# Patient Record
Sex: Male | Born: 1960 | Race: White | Hispanic: No | Marital: Married | Smoking: Never smoker
Health system: Southern US, Community
[De-identification: ages and names within clinical notes are randomized; demographics above are authoritative.]

## PROBLEM LIST (undated history)

## (undated) DIAGNOSIS — T7840XA Allergy, unspecified, initial encounter: Secondary | ICD-10-CM

## (undated) HISTORY — DX: Allergy, unspecified, initial encounter: T78.40XA

## (undated) HISTORY — PX: APPENDECTOMY: SHX54

---

## 2010-01-26 ENCOUNTER — Encounter (INDEPENDENT_AMBULATORY_CARE_PROVIDER_SITE_OTHER): Payer: Self-pay | Admitting: Surgery

## 2010-01-26 ENCOUNTER — Inpatient Hospital Stay (HOSPITAL_COMMUNITY): Admission: EM | Admit: 2010-01-26 | Discharge: 2010-01-27 | Payer: Self-pay | Admitting: Emergency Medicine

## 2010-08-18 LAB — DIFFERENTIAL
Basophils Absolute: 0 10*3/uL (ref 0.0–0.1)
Eosinophils Absolute: 0 10*3/uL (ref 0.0–0.7)
Eosinophils Relative: 0 % (ref 0–5)
Monocytes Relative: 5 % (ref 3–12)

## 2010-08-18 LAB — URINALYSIS, ROUTINE W REFLEX MICROSCOPIC
Bilirubin Urine: NEGATIVE
Hgb urine dipstick: NEGATIVE
Protein, ur: NEGATIVE mg/dL
Urobilinogen, UA: 0.2 mg/dL (ref 0.0–1.0)

## 2010-08-18 LAB — COMPREHENSIVE METABOLIC PANEL
Alkaline Phosphatase: 86 U/L (ref 39–117)
BUN: 16 mg/dL (ref 6–23)
CO2: 27 mEq/L (ref 19–32)
Calcium: 9.3 mg/dL (ref 8.4–10.5)
Chloride: 101 mEq/L (ref 96–112)
Creatinine, Ser: 1 mg/dL (ref 0.4–1.5)
GFR calc Af Amer: >60 mL/min (ref >60)
Glucose, Bld: 130 mg/dL — ABNORMAL HIGH (ref 70–99)
Sodium: 136 mEq/L (ref 135–145)

## 2010-08-18 LAB — CBC
HCT: 41.8 % (ref 39.0–52.0)
Hemoglobin: 14.2 g/dL (ref 13.0–17.0)
MCHC: 34 g/dL (ref 30.0–36.0)
RBC: 4.75 MIL/uL (ref 4.22–5.81)
WBC: 13.5 10*3/uL — ABNORMAL HIGH (ref 4.0–10.5)

## 2010-08-18 LAB — HEMOCCULT GUIAC POC 1CARD (OFFICE): Fecal Occult Bld: NEGATIVE

## 2010-10-26 ENCOUNTER — Encounter: Payer: Self-pay | Admitting: Family Medicine

## 2010-10-26 ENCOUNTER — Ambulatory Visit (INDEPENDENT_AMBULATORY_CARE_PROVIDER_SITE_OTHER): Payer: Managed Care, Other (non HMO) | Admitting: Family Medicine

## 2010-10-26 DIAGNOSIS — R269 Unspecified abnormalities of gait and mobility: Secondary | ICD-10-CM

## 2010-10-26 DIAGNOSIS — M7750 Other enthesopathy of unspecified foot: Secondary | ICD-10-CM

## 2010-10-26 DIAGNOSIS — M7741 Metatarsalgia, right foot: Secondary | ICD-10-CM

## 2010-10-26 NOTE — Assessment & Plan Note (Signed)
Pes planus with associated over-pronation. Fitted with sports insoles with scaphoid pads & metatarsal cookies as stated above.  These were comfortable in office & gait was improved. - f/u prn - may be candidate for custom orthotics in the future

## 2010-10-26 NOTE — Progress Notes (Signed)
  Subjective:    Patient ID: Francisco Perkins, male    DOB: 03/02/1961, 50 y.o.   MRN: [id number]  HPI 50yo new patient to office for evaluation of Rt foot pain.  Pain has been on-going x 8-months, denies injury or trauma.  Pain mainly along dorsum of foot, but also has pain on plantar aspect of foot along all metatarsal heads.  Denies any swelling, bruising, or redness.  Tried set of metatarsal cookies from PCP which have been helpful.  Tried set of OTC insoles which were not helpful.  Taking tylenol intermittently without much help.  He is a runner - 10-12 miles/wk, runs occasional 5-K race - last 5-K was over the weekend and did ok.  No hx of stress fracture.  No night pain.  No fevers/chills.  No hx of gout.  No numbness/tingling.   Review of Systems Per HPI, otherwise negative    Objective:   Physical Exam GEN: AOx3, NAD, pleasant SKIN: no rashes/lesions RESP: Respirations 15 & unlabored MSK: - Ankles: FROM b/l without pain.  No swelling or effusion.  No laxity.  Normal strength. - Feet: bilateral pes planus, bilateral Morton's foot.  Transverse arch collapse bilaterally - R>L.  Mildly TTP along MT heads 3-5 on the right, no plantar fascial tenderness.  Neg MT squeeze.  Left foot without any tenderness in forefoot, midfoot, or hindfoot. - Gait: no leg length difference.  Over pronation bilaterally with both walking & running.   Neuro: sensation intact to light touch VASC: pulses +2/4 DP & PT b/l       Assessment & Plan:

## 2010-10-26 NOTE — Assessment & Plan Note (Signed)
Rt foot metatarsalgia with collapse of transverse arch & pes planus - Given Sports Insoles with metatarsal cookies & medium scaphoid pads which were comfortable in the office.  Should try to wear these in all shoes.  Extra set of metatarsal cookies given for any dress shoes.  [employer] catalog given. - Ok to continue with activity - should stop activity if pain >3/10 - May ice & use tylenol or OTC NSAIDs prn - Follow-up as needed - may be candidate for custom orthotics in the future

## 2011-11-19 IMAGING — CT CT ABD-PELV W/ CM
1 of 3 series · 14 of 32 positions shown, 19 images · IV contrast (agent unspecified)
Comparison: None

CLINICAL DATA: Lower abdominal pain.

CT ABDOMEN AND PELVIS WITH CONTRAST
TECHNIQUE: Multidetector CT imaging of the abdomen and pelvis was
performed following the standard protocol during bolus
administration of intravenous contrast.
Contrast: 100 ml of Ymnipaque-JHH.

[Series 2: rtn ap with st · axial · 0.70mm/px · z∈[-458,-44]mm · 14 of 95 slices shown, 19 images]
[im 6/95  soft-tissue]
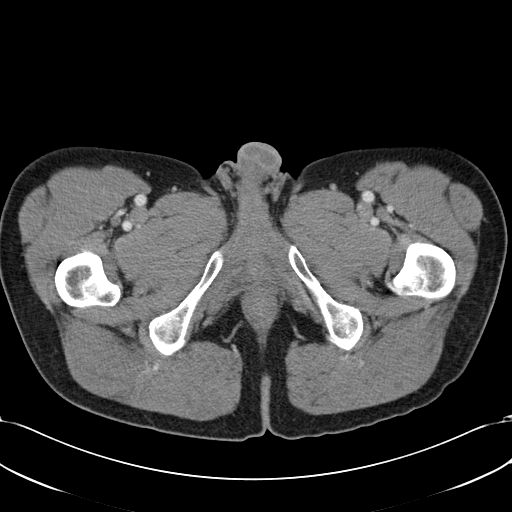
[im 6/95  bone]
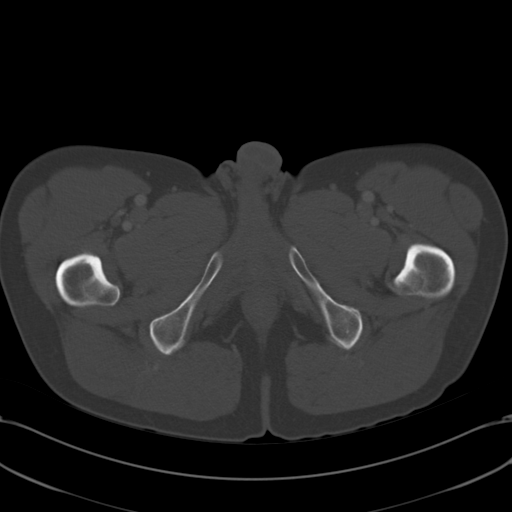
[im 12/95  soft-tissue]
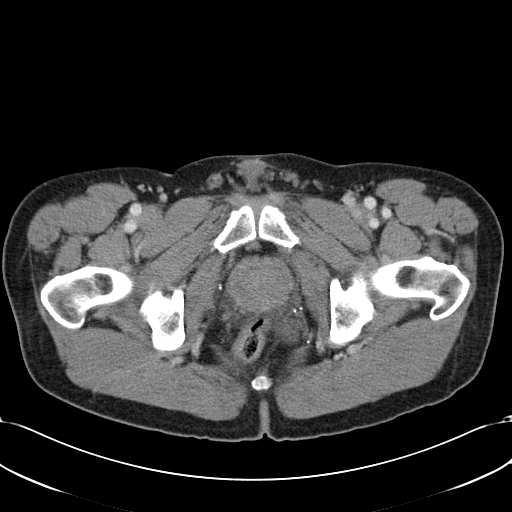
[im 23/95  soft-tissue]
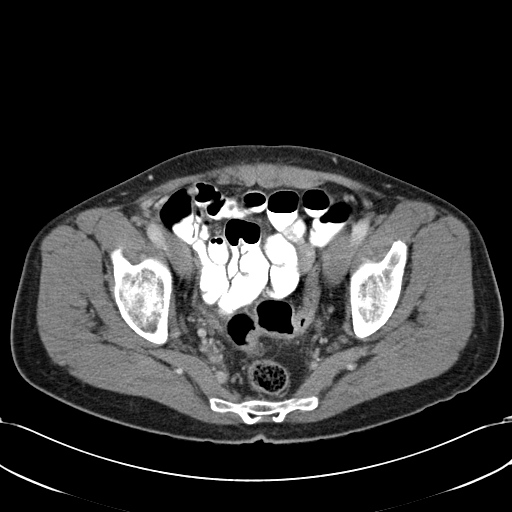
[im 28/95  soft-tissue]
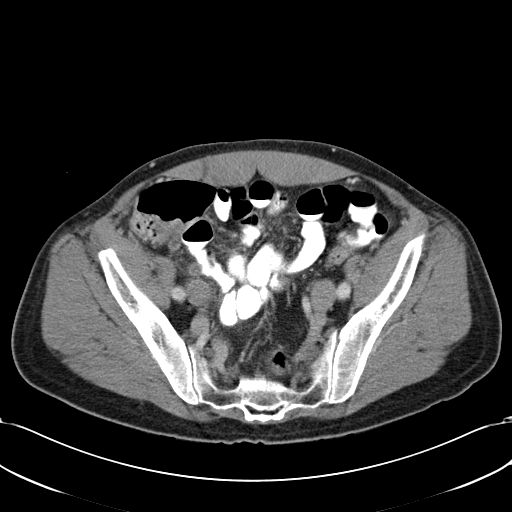
[im 34/95  soft-tissue]
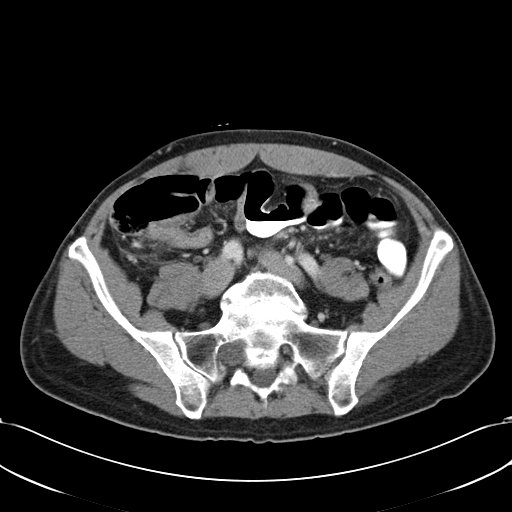
[im 39/95  soft-tissue]
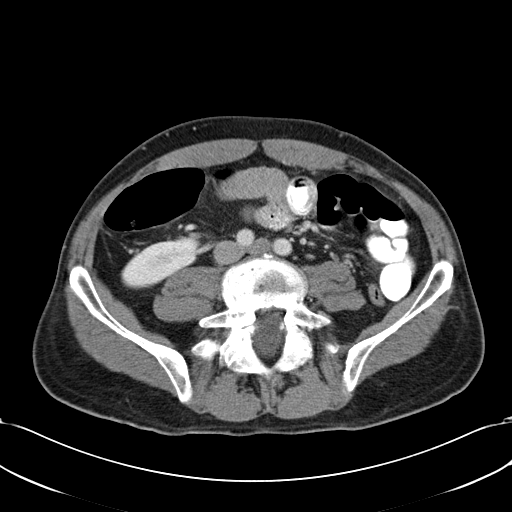
[im 50/95  soft-tissue]
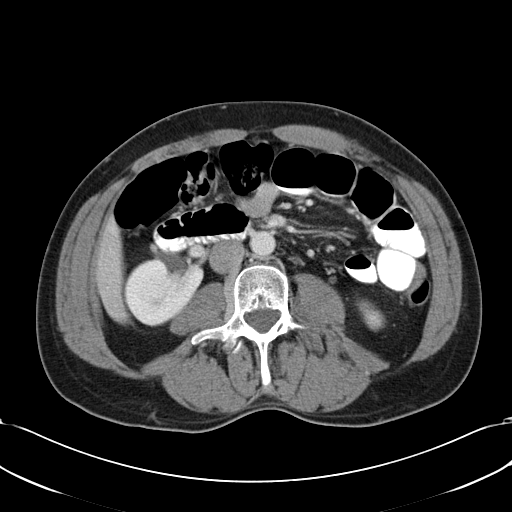
[im 56/95  soft-tissue]
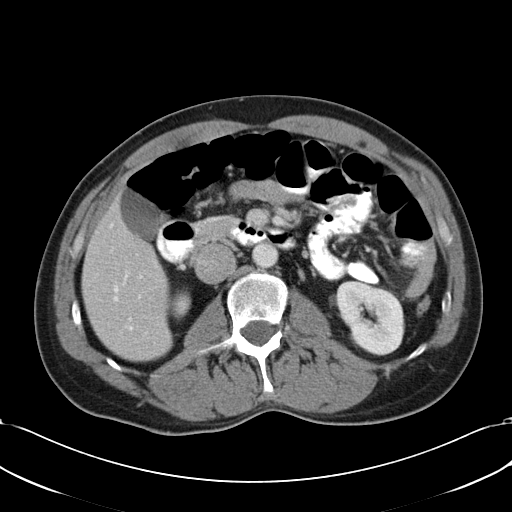
[im 61/95  soft-tissue]
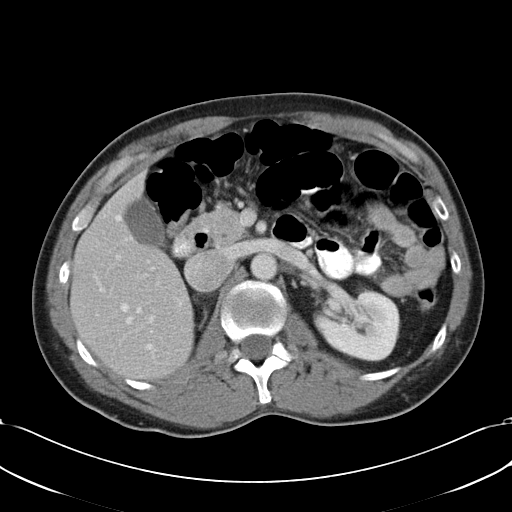
[im 61/95  bone]
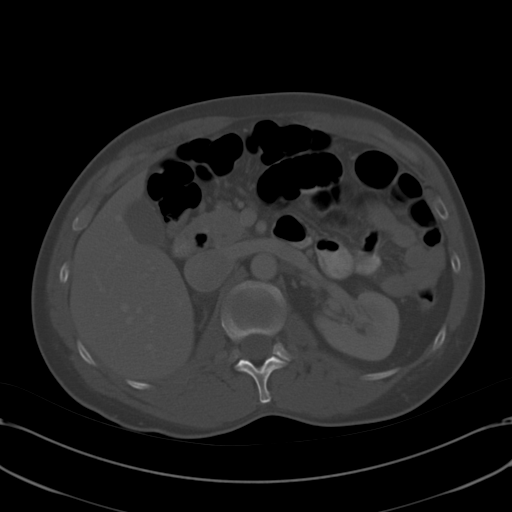
[im 67/95  soft-tissue]
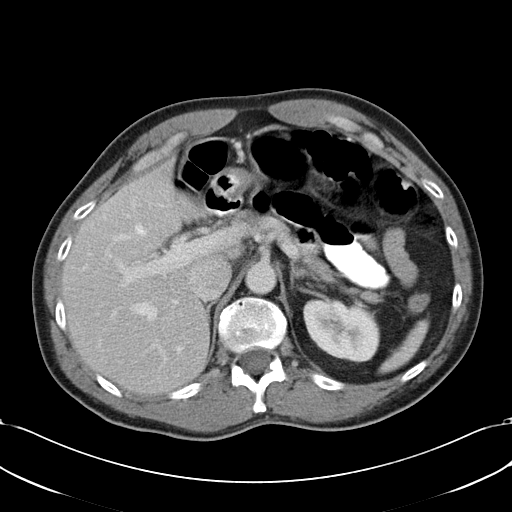
[im 72/95  soft-tissue]
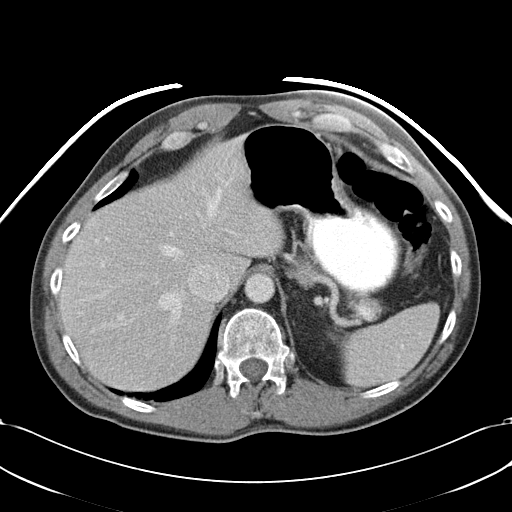
[im 72/95  lung]
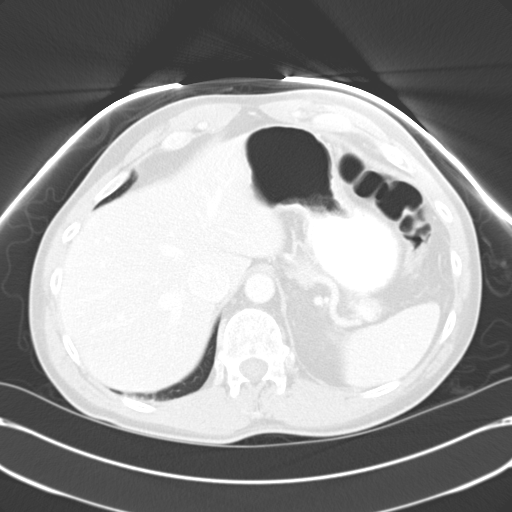
[im 78/95  lung]
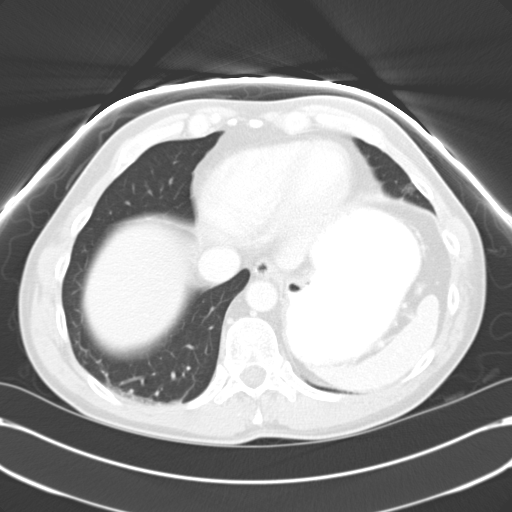
[im 83/95  soft-tissue]
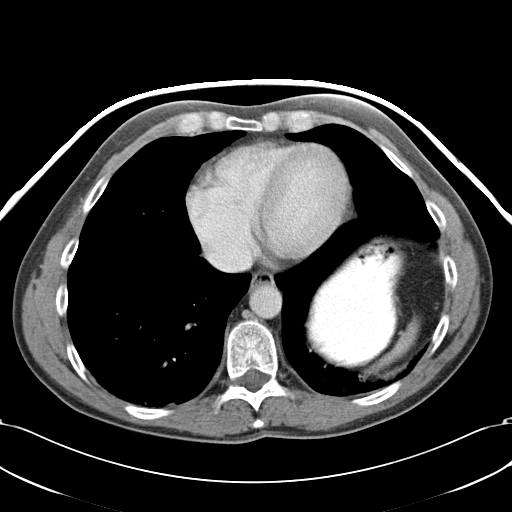
[im 83/95  lung]
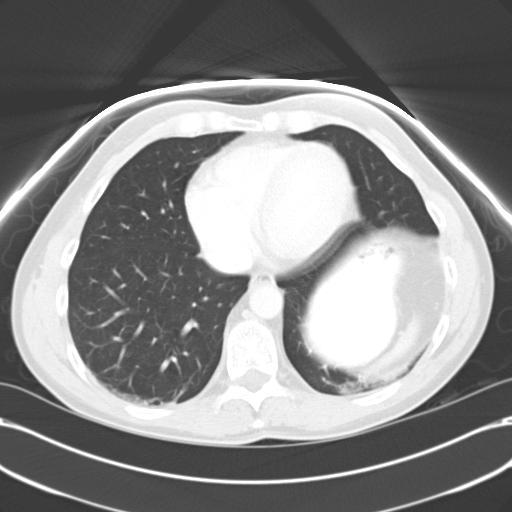
[im 89/95  soft-tissue]
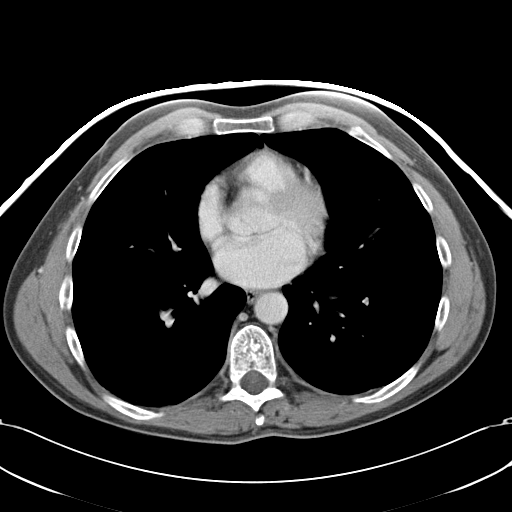
[im 89/95  lung]
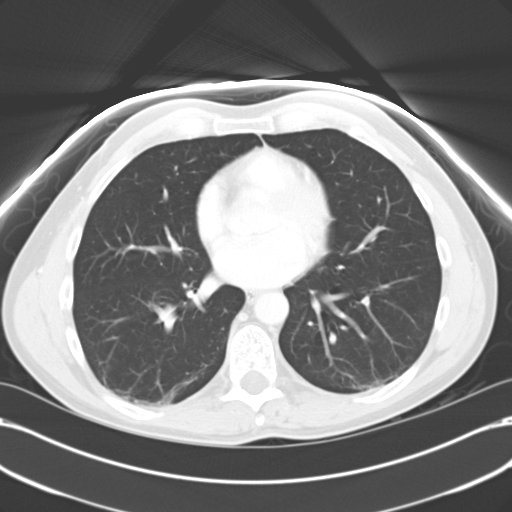

[14 of 32 positions shown; findings below may reference images not displayed]

FINDINGS: The lung bases are clear except for dependent
atelectasis.

The solid abdominal organs are unremarkable.  The right kidney is
and a non rotated position with a slightly prominent extrarenal
pelvis but no obstruction.

The stomach, duodenum, small bowel and colon are unremarkable.

The appendix is slightly dilated, fluid-filled and mildly inflamed
consistent with early acute appendicitis.  Small appendicoliths are
noted.

The bladder, prostate gland seminal vesicles are unremarkable.  No
pelvic mass, adenopathy or significant free pelvic fluid
collections.  The aorta is normal in caliber.  No mesenteric or
retroperitoneal adenopathy.

The bony structures are unremarkable.  There are bilateral pars
defects at L5 with a grade 1 spondylolisthesis.  There is a benign
appearing cystic lesion in the left pedicle and pars region of L3.
IMPRESSION: CT findings consistent with early acute appendicitis.

## 2013-05-15 ENCOUNTER — Other Ambulatory Visit: Payer: Self-pay | Admitting: Dermatology

## 2014-01-15 ENCOUNTER — Encounter: Payer: Self-pay | Admitting: Podiatrist

## 2014-01-15 ENCOUNTER — Ambulatory Visit (INDEPENDENT_AMBULATORY_CARE_PROVIDER_SITE_OTHER): Payer: Managed Care, Other (non HMO)

## 2014-01-15 ENCOUNTER — Ambulatory Visit: Payer: Managed Care, Other (non HMO) | Admitting: Podiatrist

## 2014-01-15 VITALS — BP 112/69 | HR 63 | Resp 18

## 2014-01-15 DIAGNOSIS — M898X9 Other specified disorders of bone, unspecified site: Secondary | ICD-10-CM

## 2014-01-15 DIAGNOSIS — R52 Pain, unspecified: Secondary | ICD-10-CM

## 2014-01-15 DIAGNOSIS — M7150 Other bursitis, not elsewhere classified, unspecified site: Secondary | ICD-10-CM

## 2014-01-15 NOTE — Patient Instructions (Signed)
Joint Injection Care After Refer to this sheet in the next few days. These instructions provide you with information on caring for yourself after you have had a joint injection. Your caregiver also may give you more specific instructions. Your treatment has been planned according to current medical practices, but problems sometimes occur. Call your caregiver if you have any problems or questions after your procedure. After any type of joint injection, it is not uncommon to experience:  Soreness, swelling, or bruising around the injection site.  Mild numbness, tingling, or weakness around the injection site caused by the numbing medicine used before or with the injection. It also is possible to experience the following effects associated with the specific agent after injection:  Iodine-based contrast agents:  Allergic reaction (itching, hives, widespread redness, and swelling beyond the injection site).  Corticosteroids (These effects are rare.):  Allergic reaction.  Increased blood sugar levels (If you have diabetes and you notice that your blood sugar levels have increased, notify your caregiver).  Increased blood pressure levels   These effects all should resolve within a day after your procedure.  HOME CARE INSTRUCTIONS   You may apply ice to your injection site to reduce pain and swelling the day of your procedure. Ice may be applied 03-04 times:  Put ice in a plastic bag.  Place a towel between your skin and the bag.  Leave the ice on for no longer than 15-20 minutes each time. SEEK IMMEDIATE MEDICAL CARE IF:   Pain and swelling get worse rather than better or extend beyond the injection site.  Numbness does not go away.  Blood or fluid continues to leak from the injection site.  You have chest pain.  You have swelling of your face or tongue.  You have trouble breathing or you become dizzy.  You develop a fever, chills, or severe tenderness at the injection site  that last longer than 1 day. MAKE SURE YOU:  Understand these instructions.  Watch your condition.  Get help right away if you are not doing well or if you get worse. Document Released: 02/01/2011 Document Revised: 08/13/2011 Document Reviewed: 02/01/2011 [facility] Patient Information 2015 [employer], [place]. This information is not intended to replace advice given to you by your health care provider. Make sure you discuss any questions you have with your health care provider.

## 2014-01-15 NOTE — Progress Notes (Signed)
   Subjective:    Patient ID: Francisco Perkins, male    DOB: 07/05/1960, 53 y.o.   MRN: [id number]  HPI MY RIGHT FOOT HURTS ON TOP AND HAS BEEN GOING ON FOR ABOUT 3 YEARS AND ACHE AND HURTS IF I LACE MY SHOES TOO TIGHT AND THERE IS NO SWELLING AND I THINK INJURED IT RECENTLY WHEN RUNNING AND I HAVE HAD MY INSERTS FOR ABOUT 3 YEARS NOW AND GOT THEM FROM AN ORTHOPEDIC DOCTOR    Review of Systems  HENT:       Ringing in ears  Allergic/Immunologic: Positive for environmental allergies.  All other systems reviewed and are negative.      Objective:   Physical Exam Patient is awake, alert, and oriented x 3.  In no acute distress.  Vascular status is intact with palpable pedal pulses at 2/4 DP and PT bilateral and capillary refill time within normal limits. Neurological sensation is also intact bilaterally via Semmes Weinstein monofilament at 5/5 sites. Light touch, vibratory sensation, Achilles tendon reflex is intact. Dermatological exam reveals skin color, turger and texture as normal. No open lesions present.  Musculature intact with dorsiflexion, plantarflexion, inversion, eversion.  Mild swelling and tenderness is present dorsum right foot  At the lateral aspect of the first metatarsal base medial cuneiform.  Palpable exostosis is noted as seen on xray and is in the area of reported pain.  Irritation and puffiness in the area is present.     Assessment & Plan:  Dorsal exostosis, bursitis dorsum of foot  Plan:  Injected the area of maximal tenderness with kenalog and marcaine plain under sterile technique to decrease the inflammation around the joint.  Discussed changing the lacing pattern of his shoes to decrease the pressure from the shoes.  Also discussed the possibility of shaving down the bone spur if the injection does not offer relief.  Discussed a different type of insert as the ones he has are soft and pliable.  Will try the powerstep inserts first before considering custom.

## 2014-02-12 ENCOUNTER — Ambulatory Visit: Payer: Managed Care, Other (non HMO) | Admitting: Podiatrist

## 2014-02-19 ENCOUNTER — Ambulatory Visit: Payer: Managed Care, Other (non HMO) | Admitting: Podiatrist

## 2014-04-14 ENCOUNTER — Ambulatory Visit: Payer: Managed Care, Other (non HMO) | Admitting: Podiatrist

## 2014-04-16 ENCOUNTER — Ambulatory Visit (INDEPENDENT_AMBULATORY_CARE_PROVIDER_SITE_OTHER): Payer: Managed Care, Other (non HMO) | Admitting: Podiatrist

## 2014-04-16 ENCOUNTER — Encounter: Payer: Self-pay | Admitting: Podiatrist

## 2014-04-16 VITALS — BP 118/72 | HR 67 | Resp 16

## 2014-04-16 DIAGNOSIS — M67479 Ganglion, unspecified ankle and foot: Secondary | ICD-10-CM

## 2014-04-16 NOTE — Progress Notes (Signed)
    Subjective:   Patient presents for follow up of cyst on the top of the right foot. He relates it is uncomfortable and sits just under his shoes where they hit across the top.  He relates he can feel the area and it is uncomfortable.    Objective:   Physical Exam Patient is awake, alert, and oriented x 3.  In no acute distress.  Vascular status is intact with palpable pedal pulses at 2/4 DP and PT bilateral and capillary refill time within normal limits. Neurological sensation is also intact bilaterally via Semmes Weinstein monofilament at 5/5 sites. Light touch, vibratory sensation, Achilles tendon reflex is intact. Dermatological exam reveals skin color, turger and texture as normal. No open lesions present.  Musculature intact with dorsiflexion, plantarflexion, inversion, eversion.  Mild swelling and tenderness is present dorsum right foot  At the lateral aspect of the first metatarsal base medial cuneiform.  Palpable ganglion is palpated with exostosis continue to be seen on xray and is in the area of reported pain.  Irritation and puffiness in the area is present.     Assessment & Plan:  Dorsal exostosis, ganglion cyst  Plan:  Injected the cyst itself with kenalog and marcaine plain under sterile technique  Also discussed the possibility of shaving down the bone spur if the injection does not offer relief-  We will get a surgical estimate for shaving down the bone.

## 2014-06-11 ENCOUNTER — Other Ambulatory Visit: Payer: Self-pay | Admitting: Dermatology

## 2014-09-03 ENCOUNTER — Other Ambulatory Visit: Payer: Self-pay | Admitting: Gastroenterology

## 2014-09-24 ENCOUNTER — Encounter: Payer: Self-pay | Admitting: Podiatrist

## 2014-09-24 ENCOUNTER — Ambulatory Visit (INDEPENDENT_AMBULATORY_CARE_PROVIDER_SITE_OTHER): Payer: Managed Care, Other (non HMO) | Admitting: Podiatrist

## 2014-09-24 VITALS — BP 126/79 | HR 66 | Resp 12

## 2014-09-24 DIAGNOSIS — M67479 Ganglion, unspecified ankle and foot: Secondary | ICD-10-CM | POA: Diagnosis not present

## 2014-09-24 DIAGNOSIS — M898X9 Other specified disorders of bone, unspecified site: Secondary | ICD-10-CM

## 2014-09-24 NOTE — Progress Notes (Signed)
  Chief Complaint  Patient presents with  . Foot Pain    "hurts on top of right foot, pain returned 1 1/2 week after injection"      Subjective:   Patient presents for follow up of cyst on the top of the right foot. He relates it is uncomfortable and sits just under his shoes where they hit across the top.  He relates it usually hurts when he runs however he has been running lately and the area actually feels better.   Objective:   Physical Exam Patient is awake, alert, and oriented x 3.  In no acute distress.  Vascular status is intact with palpable pedal pulses at 2/4 DP and PT bilateral and capillary refill time within normal limits. Neurological sensation is also intact bilaterally via Semmes Weinstein monofilament at 5/5 sites. Light touch, vibratory sensation, Achilles tendon reflex is intact. Dermatological exam reveals skin color, turger and texture as normal. No open lesions present.  Musculature intact with dorsiflexion, plantarflexion, inversion, eversion.  continuedis present dorsum right foot  At the lateral aspect of the first metatarsal base medial cuneiform.   ganglion is unable to be palpated today however exostosis is easily palpated with exostosis continue to be seen on xray and is in the area of reported pain.      Assessment & Plan:  Dorsal exostosis, ganglion cyst  Plan:  Injected the cyst itself with kenalog and marcaine plain under sterile technique  Also discussed the possibility of shaving down the bone spur if the injection does not offer relief- he will consider this option.  May also consider an mri to be sure there is a cyst present to remove versus the bone spur itself.

## 2015-11-01 DIAGNOSIS — M722 Plantar fascial fibromatosis: Secondary | ICD-10-CM

## 2016-03-14 ENCOUNTER — Ambulatory Visit (INDEPENDENT_AMBULATORY_CARE_PROVIDER_SITE_OTHER): Payer: Managed Care, Other (non HMO) | Admitting: Podiatry

## 2016-03-14 ENCOUNTER — Ambulatory Visit (INDEPENDENT_AMBULATORY_CARE_PROVIDER_SITE_OTHER): Payer: Managed Care, Other (non HMO)

## 2016-03-14 DIAGNOSIS — M79671 Pain in right foot: Secondary | ICD-10-CM

## 2016-03-14 DIAGNOSIS — M898X9 Other specified disorders of bone, unspecified site: Secondary | ICD-10-CM

## 2016-03-19 NOTE — Progress Notes (Signed)
Subjective:  Patient presents with pain and tenderness to the dorsal aspect of the right foot. Patient states that he has been diagnosed with a dorsal spur to the right midfoot in the past. Patient presents today for further treatment and evaluation. Patient states that it only hurts him intermittently.    Objective/Physical Exam General: The patient is alert and oriented x3 in no acute distress.  Dermatology: Skin is warm, dry and supple bilateral lower extremities. Negative for open lesions or macerations.  Vascular: Palpable pedal pulses bilaterally. No edema or erythema noted. Capillary refill within normal limits.  Neurological: Epicritic and protective threshold grossly intact bilaterally.   Musculoskeletal Exam: Pain on palpation noted to the dorsal aspect of the patient's right foot. Range of motion within normal limits to all pedal and ankle joints bilateral. Muscle strength 5/5 in all groups bilateral.   Radiographic Exam:  Dorsal spur formation noted to the midfoot on lateral view x-rays. Normal osseous mineralization. Joint spaces preserved. No fracture/dislocation/boney destruction.    Assessment: #1 dorsal spur right midfoot #2 pain in right foot   Plan of Care:  #1 Patient was evaluated. #2 discussed surgical versus conservative management of dorsal spurring including shoe gear modification, anti-inflammatory medication. Patient opts for conservative management at this time. #3 patient is to return to clinic when necessary   Dr. Felecia ShellingBrent M. Antonae Zbikowski, DPM [facility]

## 2018-12-12 ENCOUNTER — Encounter (INDEPENDENT_AMBULATORY_CARE_PROVIDER_SITE_OTHER): Payer: Self-pay

## 2018-12-15 ENCOUNTER — Encounter (INDEPENDENT_AMBULATORY_CARE_PROVIDER_SITE_OTHER): Payer: Self-pay | Admitting: Family Medicine

## 2018-12-15 ENCOUNTER — Ambulatory Visit (INDEPENDENT_AMBULATORY_CARE_PROVIDER_SITE_OTHER): Payer: Commercial Managed Care - POS | Admitting: Family Medicine

## 2018-12-15 VITALS — BP 120/80 | HR 76 | Temp 98.6°F | Resp 16 | Ht 67.0 in | Wt 159.0 lb

## 2018-12-15 DIAGNOSIS — Z13220 Encounter for screening for lipoid disorders: Secondary | ICD-10-CM

## 2018-12-15 DIAGNOSIS — Z1211 Encounter for screening for malignant neoplasm of colon: Secondary | ICD-10-CM

## 2018-12-15 DIAGNOSIS — Z23 Encounter for immunization: Secondary | ICD-10-CM

## 2018-12-15 DIAGNOSIS — Z0000 Encounter for general adult medical examination without abnormal findings: Secondary | ICD-10-CM

## 2018-12-15 DIAGNOSIS — D171 Benign lipomatous neoplasm of skin and subcutaneous tissue of trunk: Secondary | ICD-10-CM

## 2018-12-15 DIAGNOSIS — Z85828 Personal history of other malignant neoplasm of skin: Secondary | ICD-10-CM

## 2018-12-15 DIAGNOSIS — Z131 Encounter for screening for diabetes mellitus: Secondary | ICD-10-CM

## 2018-12-15 DIAGNOSIS — Z125 Encounter for screening for malignant neoplasm of prostate: Secondary | ICD-10-CM

## 2018-12-15 DIAGNOSIS — K649 Unspecified hemorrhoids: Secondary | ICD-10-CM

## 2018-12-15 NOTE — Progress Notes (Signed)
Chart reviewed and agree with assessment and plan.

## 2018-12-15 NOTE — Patient Instructions (Signed)
-  You were seen today for your annual wellness exam. It was nice to meet you!   -Your vaccinations: tetanus booster given today, next due in 2030.  -Colon cancer screening: ordered today. Prostate cancer screening: ordered today.   -Labs: I will send via mychart if normal, call if abnormal.   -Continue to avoid smoking and tobacco and other drugs. Try to drink less than 1 alcoholic drink a day. Apply sunscreen 30 minutes before going out to the sun.  -Continue to eat a balanced diet with at least 4-5 servings of fruits and vegetables daily. Try to stay away from junk food and sugary drinks. Diet should include various fruits, vegetables, whole grains, and fiber. Keep it low in saturated fat, cholesterol, and sodium.  -Importance of regular moderate intensity exercise with exercise goal of 150 minutes per week was discussed. Moderate intensity is when you get your heart rate up and break a sweat, or unable to sing out loud your favorite song. This will help maintain a healthy weight, as well as help with anxiety, depression and stress management.   -Your mental health is just as important as your physical health. Consider incorporating yoga and meditation into your regular health maintenance routines. Apps such as Buddhify, Calm, and Headspace are helpful for stress management and mindfulness meditation.  -Please call our office if you have urgent needs or send me a message if you have any questions or concerns, otherwise we will plan to see you at your next wellness visit in 1 year.

## 2018-12-15 NOTE — Progress Notes (Signed)
[facility] [place] [facility] [employer] [facility]                       Date of Exam: 12/15/2018 9:47 AM        Patient ID: Leonard Cox is a 58 y.o. male.  Attending Physician: Martyn Malay, MD        Chief Complaint:    Chief Complaint   Patient presents with   . Annual Exam               HPI:    PMH, family history, CVD risk factors, cancer screening, vaccinations, mental health, men's health reviewed and updated in chart.    -last colo age 34  -plan to see Dr. Shellee Milo  -has a history of high cholesterol, has avoided cholesterol med  -walks daily, every other day strength training  -regular diet  -taking creatine   -takes baby asa, cinnamon BID, calcium 500mg  BID + vit D, multivitamin TIW, fishoil  -father with heart attack age 81; 2 brothers      Issues addressed outside of wellness:   #Hemorroids  -not pruritic, has had before  -not painful, has been before  -no rectal bleeding          Problem List:    Patient Active Problem List   Diagnosis   . History of basal cell cancer   . Hemorrhoids, unspecified hemorrhoid type             Current Meds:    Outpatient Medications Marked as Taking for the 12/15/18 encounter (Office Visit) with Martyn Malay, MD   Medication Sig Dispense Refill   . aspirin 81 MG chewable tablet Chew 81 mg by mouth daily            Allergies:    Allergies no known allergies          Past Surgical History:    Past Surgical History:   Procedure Laterality Date   . APPENDECTOMY             Family History:    Family History   Problem Relation Age of Onset   . Skin cancer Mother    . Melanoma Brother            Social History:    Social History     Tobacco Use   . Smoking status: Never Smoker   Substance Use Topics   . Alcohol use: Yes     Alcohol/week: 1.0 standard drinks     Types: 1 Glasses of wine per week   . Drug use: Never          The following sections were reviewed this encounter by the provider:   Problems             Vital Signs:    BP 120/80    Pulse 76   Temp 98.6 F (37 C)   Resp 16   Ht 1.702 m (5\' 7" )   Wt 72.1 kg (159 lb)   BMI 24.90 kg/m          ROS:    -No fever, chills, fatigue  -No trouble falling asleep or insomnia  -No anxiety, depression  -No headache, blurry vision  -No congestion, ear pain, sore throat  -No dysphagia, globus sensation  -No cp, palpitations  -No SOB, cough, wheezing  -No abdominal pain, n/v, diarrhea, constipation, no recent blood in stool  -No dysuria, no urinary  frequency that affects QoL  -No edema  -No joint pain  -No rashes - many moles          Physical Exam:    Gen: alert, nad, pleasant  Psych: appropriate affect, appropriate mood, normal speech, normal attention  Eyes: PERRLA, EOMI, no eyelid swelling, no scleral icterus  ENT: no rhinorrhea, normal oropharynx, TMs clear b/l  Neck: supple, no LAD  CV: regular rate, normal rhythm, no m/r/g  Pulm: no increased wob, cab, no wheezes/rales/rhonchi  Abdominal: soft, non-distended, non-tender to palpation  -1.5x1.5cm subcutaneous non mobile nodule, no overlying skin changes, not TTP, no reducible  MSK: no lower extremity edema, normal gait  Skin:   -back, chest, ears examined: multiple scattered nevi, seborrheic keratoses, cherry angiomas on back; no bleeding or irregular lesions visualzied  Neuro: CN II-XII grossly intact        Assessment:    1. Encounter for wellness examination in adult    2. History of basal cell cancer  - Referral to Dermatology - EXTERNAL    3. Lipoma of torso    4. Screening for diabetes mellitus  - Basic Metabolic Panel  - Hemoglobin A1C    5. Screening for hyperlipidemia  - Lipid panel    6. Screening for colon cancer  - Ambulatory referral to Gastroenterology    7. Screening for prostate cancer  - PSA    8. Immunization due  - Tdap vaccine greater than or equal to 7yo IM    9. Hemorrhoids, unspecified hemorrhoid type            Plan:    Health Maintenance:  Colonoscopy is due.  GI consult for colonoscopy ordered. Dermatology screening is  due.  Recommend dermatology consult at earliest convenience.  Prostate cancer screening is due.  PSA ordered. Tetnaus booster given today.     Discussed healthy lifestyle. Patient instructions provided.             Follow-up:    Return in about 1 year (around 12/15/2019), or if symptoms worsen or fail to improve, for wellness.         Martyn Malay, MD

## 2018-12-16 LAB — BASIC METABOLIC PANEL
BUN / Creatinine Ratio: 16 (ref 10–24)
BUN: 16 mg/dL (ref 8–27)
CO2: 24 mmol/L (ref 20–29)
Calcium: 9.4 mg/dL (ref 8.6–10.2)
Chloride: 104 mmol/L (ref 96–106)
Creatinine: 1.03 mg/dL (ref 0.76–1.27)
EGFR: 79 mL/min/{1.73_m2} (ref >59)
EGFR: 91 mL/min/{1.73_m2} (ref >59)
Glucose: 93 mg/dL (ref 65–99)
Potassium: 4.8 mmol/L (ref 3.5–5.2)
Sodium: 142 mmol/L (ref 134–144)

## 2018-12-16 LAB — HEMOGLOBIN A1C: Hemoglobin A1C: 5.6 % (ref 4.8–5.6)

## 2018-12-16 LAB — PSA: Prostate Specific Antigen, Total: 0.9 ng/mL (ref 0.0–4.0)

## 2018-12-16 LAB — LIPID PANEL
Cholesterol / HDL Ratio: 4.2 ratio (ref 0.0–5.0)
Cholesterol: 224 mg/dL — ABNORMAL HIGH (ref 100–199)
HDL: 53 mg/dL (ref >39)
LDL Calculated: 150 mg/dL — ABNORMAL HIGH (ref 0–99)
Triglycerides: 106 mg/dL (ref 0–149)
VLDL Calculated: 21 mg/dL (ref 5–40)

## 2018-12-22 NOTE — Progress Notes (Signed)
ASCVD 8.1%, not on statin or asa.

## 2018-12-28 ENCOUNTER — Encounter (INDEPENDENT_AMBULATORY_CARE_PROVIDER_SITE_OTHER): Payer: Self-pay | Admitting: Family Medicine

## 2018-12-31 ENCOUNTER — Other Ambulatory Visit (INDEPENDENT_AMBULATORY_CARE_PROVIDER_SITE_OTHER): Payer: Self-pay | Admitting: Family Medicine

## 2018-12-31 DIAGNOSIS — E7841 Elevated Lipoprotein(a): Secondary | ICD-10-CM

## 2018-12-31 MED ORDER — ATORVASTATIN CALCIUM 20 MG PO TABS
20.0000 mg | ORAL_TABLET | Freq: Every day | ORAL | 5 refills | Status: DC
Start: 2018-12-31 — End: 2019-06-28

## 2019-01-04 ENCOUNTER — Telehealth: Payer: Self-pay | Admitting: Family Medicine

## 2019-01-04 NOTE — Telephone Encounter (Signed)
Patient calling about a 2-day history of relapsing remitting left upper abdominal pain.  Describes it as a sharp stabbing pain is not always there.  States that he has had incomplete voiding of urine and increased urgency.  States that this somewhat feels similar to kidney stones that he has had in the past.  Is still having regular bowel movements without melena or hematochezia.  Denies nausea, vomiting, fevers, chills, decreased p.o. intake.  States that the pain does not radiate anywhere.  Recommended patient go to the ED for further work-up.      Albertine Grates, MD PGY2  Family Medicine

## 2019-01-09 ENCOUNTER — Encounter (INDEPENDENT_AMBULATORY_CARE_PROVIDER_SITE_OTHER): Payer: Self-pay | Admitting: Family Medicine

## 2019-01-12 ENCOUNTER — Encounter (INDEPENDENT_AMBULATORY_CARE_PROVIDER_SITE_OTHER): Payer: Self-pay | Admitting: Family Medicine

## 2019-01-12 DIAGNOSIS — Z960 Presence of urogenital implants: Secondary | ICD-10-CM | POA: Insufficient documentation

## 2019-02-04 ENCOUNTER — Encounter (INDEPENDENT_AMBULATORY_CARE_PROVIDER_SITE_OTHER): Payer: Self-pay | Admitting: Family Medicine

## 2019-02-09 ENCOUNTER — Other Ambulatory Visit (INDEPENDENT_AMBULATORY_CARE_PROVIDER_SITE_OTHER): Payer: Self-pay | Admitting: Family Medicine

## 2019-02-09 DIAGNOSIS — Z960 Presence of urogenital implants: Secondary | ICD-10-CM

## 2019-02-09 MED ORDER — OXYBUTYNIN CHLORIDE 5 MG PO TABS
5.0000 mg | ORAL_TABLET | Freq: Three times a day (TID) | ORAL | 0 refills | Status: DC
Start: 2019-02-09 — End: 2019-12-16

## 2019-04-02 ENCOUNTER — Encounter (INDEPENDENT_AMBULATORY_CARE_PROVIDER_SITE_OTHER): Payer: Self-pay | Admitting: Family Medicine

## 2019-04-02 DIAGNOSIS — E7841 Elevated Lipoprotein(a): Secondary | ICD-10-CM

## 2019-04-17 ENCOUNTER — Other Ambulatory Visit: Payer: Self-pay | Admitting: Urology

## 2019-05-19 ENCOUNTER — Encounter (INDEPENDENT_AMBULATORY_CARE_PROVIDER_SITE_OTHER): Payer: Self-pay | Admitting: Family Medicine

## 2019-05-22 ENCOUNTER — Encounter (INDEPENDENT_AMBULATORY_CARE_PROVIDER_SITE_OTHER): Payer: Self-pay

## 2019-06-27 ENCOUNTER — Other Ambulatory Visit (INDEPENDENT_AMBULATORY_CARE_PROVIDER_SITE_OTHER): Payer: Self-pay | Admitting: Family Medicine

## 2019-06-27 DIAGNOSIS — E7841 Elevated Lipoprotein(a): Secondary | ICD-10-CM

## 2019-07-10 ENCOUNTER — Encounter (INDEPENDENT_AMBULATORY_CARE_PROVIDER_SITE_OTHER): Payer: Self-pay

## 2019-12-16 ENCOUNTER — Ambulatory Visit (INDEPENDENT_AMBULATORY_CARE_PROVIDER_SITE_OTHER): Admitting: Family Medicine

## 2019-12-16 ENCOUNTER — Encounter (INDEPENDENT_AMBULATORY_CARE_PROVIDER_SITE_OTHER): Payer: Self-pay | Admitting: Family Medicine

## 2019-12-16 VITALS — BP 110/82 | HR 68 | Temp 97.3°F | Resp 13 | Ht 67.0 in | Wt 156.0 lb

## 2019-12-16 DIAGNOSIS — Z1211 Encounter for screening for malignant neoplasm of colon: Secondary | ICD-10-CM

## 2019-12-16 DIAGNOSIS — Z1159 Encounter for screening for other viral diseases: Secondary | ICD-10-CM

## 2019-12-16 DIAGNOSIS — E7800 Pure hypercholesterolemia, unspecified: Secondary | ICD-10-CM

## 2019-12-16 DIAGNOSIS — Z0000 Encounter for general adult medical examination without abnormal findings: Secondary | ICD-10-CM

## 2019-12-16 DIAGNOSIS — N401 Enlarged prostate with lower urinary tract symptoms: Secondary | ICD-10-CM

## 2019-12-16 DIAGNOSIS — Z0001 Encounter for general adult medical examination with abnormal findings: Secondary | ICD-10-CM

## 2019-12-16 DIAGNOSIS — R3914 Feeling of incomplete bladder emptying: Secondary | ICD-10-CM

## 2019-12-16 MED ORDER — TAMSULOSIN HCL 0.4 MG PO CAPS
0.40 mg | ORAL_CAPSULE | Freq: Every day | ORAL | 3 refills | Status: DC
Start: 2019-12-16 — End: 2020-03-02

## 2019-12-16 NOTE — Progress Notes (Signed)
[facility] [place] [facility] [place] [facility]                   Date of Exam: 12/16/2019 10:31 AM        Patient ID: Leonard Cox is a 59 y.o. male.  Attending Physician: Inda Castle, MD        Chief Complaint:    Chief Complaint   Patient presents with   . Annual Exam     post-void residual urination              HPI:    HPI    Leonard Cox is a 59 y.o. male who presents for Annual Exam (post-void residual urination )    Acute concerns:  Urinary urgency/incomplete emptying- He reports having a kidney stone in August of last year. He had surgical removal due to size of the stone (lithoptripsy?). Since that time he has had the sensation of incomplete bladder emptying and urgency periodically despite not drinking water or fluids. He relates the symptoms to the timeline of his kidney stone. He denies hematuria, some dribbling - not a strong flow. He gets up 2 times per night to use the bathroom. No pain with bowel movements.       Health Maintenance  High Blood Pressure Screening: WNL  Tobacco Smoking Cessation: Never smoker   Lung Cancer Screening if 85-80 yo, 30 pack year history, quit within smoking 15 years:  AAA Screening (65-75) if smoked: Never smoker   Alcohol Abuse Screening: SAS-Q- Negative   Depression: PHQ 2- Negative    Diabetes Screening:  Hemoglobin A1C - WNL last year   Statin for Primary Prevention of CVD:  Lipid - on Atorvastatin 20 mg daily   The 10-year ASCVD risk score Denman George [place] Jr., et al., 2013) is: 7.7%    Values used to calculate the score:      Age: 34 years      Sex: Male      Is Non-Hispanic African American: No      Diabetic: No      Tobacco smoker: No      Systolic Blood Pressure: 110 mmHg      Is BP treated: No      HDL Cholesterol: 53 mg/dL      Total Cholesterol: 224 mg/dL  ASA for Prevention of CVD and CRC if risk >10% in 10 years: Not yet indicated   Hep C: Indicated- ordered  HIV screening: Declined by patient  Chlamydia/ Gonorrhea:  Declined by patient  Syphilis Screening: Declined by patient  Healthy Diet: Balanced; limited fast food, no soda. Caffeine 2 cups tea daily   Obesity: BMI 24.4, swims 3 times a week, walks and runs 2 days a week. Does a modified P90x; meets 150 minutes weekly   Colorectal Cancer Screening: Colonoscopies completed;   Prostate Cancer Screening:  Patient decided on screening for this   Fall Prevention: Not indicated   Vaccines:      Pneumonia - UTD      COVID - UTD      Shingles - UTD      Tdap - UTD    We engaged in shared decision making and patient elected or declined screening tests.           Current Meds:    Outpatient Medications Marked as Taking for the 12/16/19 encounter (Office Visit) with Argenis Kumari, Mauro Kaufmann, MD   Medication Sig Dispense Refill   . atorvastatin (LIPITOR)  20 MG tablet TAKE 1 TABLET(20 MG) BY MOUTH DAILY 90 tablet 3          Allergies:    No Known Allergies        The following sections were reviewed this encounter by the provider:   Tobacco  Allergies  Meds  Problems  Med Hx  Surg Hx  Fam Hx             ROS:    Constitutional: Negative for chills and fever.   HENT: Negative for congestion, sinus pain and sore throat.    Respiratory: Negative for cough, sputum production, shortness of breath and wheezing.    Cardiovascular: Negative for chest pain, palpitations and leg swelling.   Gastrointestinal: Negative for abdominal pain, constipation, diarrhea, nausea and vomiting.   Musculoskeletal: Negative for falls, myalgias and neck pain.   Skin: Negative for rash.   Neurological: Negative for dizziness, loss of consciousness and headaches.   Psychiatric/Behavioral: Negative for depression and insomnia        Physical Exam:     Vital Signs:    BP 110/82 (BP Site: Left arm, Patient Position: Sitting)   Pulse 68   Temp 97.3 F (36.3 C) (Tympanic)   Resp 13   Ht 1.702 m (5\' 7" )   Wt 70.8 kg (156 lb)   BMI 24.43 kg/m       GENERAL: Comfortable 59 y.o. male in no distress, interactive,  well-appearing and well developed.  HEENT: Normocephalic and atraumatic. Wearing a mask  NECK: Neck supple. Trachea midline  CARDIO: RRR. No murmurs, rubs, or gallops.   PULM: No respiratory distress. Good air movement on auscultation. No wheezes, crackles, or upper airway sounds.   GI: Soft, nontender, nondistended. Bowel sounds present.  MSK: No edema or deformity. Strength and ROM grossly normal  SKIN:  No rash on exposed skin. Scattered SKs  NEURO: AOx3, no focal deficit, normal tone.  PSYCH: Normal affect.        Assessment/Plan:    Cordarius was seen today for annual exam.    Wellness examination: Updated per USPSTF recommendations.    Elevated cholesterol  -     Lipid panel    Encounter for hepatitis C screening test for low risk patient  -     Hepatitis C (HCV) antibody, Total    Screening for colon cancer: Hx polyps (unknown number) 5 years ago. Recommended 5 year follow up  -     Occult Blood, Fecal, Immunoassay    Benign prostatic hyperplasia with incomplete bladder emptying: Acute, symptomatic. Symptoms are most like BPH as opposed to related to renal stone or more concerning etiology. Start tamsulosin. SE discussed.  -     PSA  -     tamsulosin (FLOMAX) 0.4 MG Cap; Take 1 capsule (0.4 mg total) by mouth Daily after dinner           Follow-up:    Return in about 1 year (around 12/15/2020) for Wellness with Zenya Hickam.          Mauro Kaufmann Enzley Kitchens, MD  Assistant Professor of Family Medicine  [facility]

## 2019-12-17 LAB — LIPID PANEL
Cholesterol / HDL Ratio: 3 ratio (ref 0.0–5.0)
Cholesterol: 146 mg/dL (ref 100–199)
HDL: 49 mg/dL (ref >39)
LDL Chol Calculated (NIH): 84 mg/dL (ref 0–99)
Triglycerides: 64 mg/dL (ref 0–149)
VLDL Calculated: 13 mg/dL (ref 5–40)

## 2019-12-17 LAB — PSA: Prostate Specific Antigen, Total: 0.9 ng/mL (ref 0.0–4.0)

## 2019-12-17 LAB — HEPATITIS C ANTIBODY: HCV AB: <0.1 s/co ratio (ref 0.0–0.9)

## 2019-12-17 NOTE — Progress Notes (Signed)
Message sent to patient, WNL

## 2019-12-21 LAB — STOOL OCCULT BLOOD, IMMUNOASSAY, (NOT GUAIAC BASED): Occult Blood, Fecal, IA: NEGATIVE

## 2019-12-21 LAB — SPECIMEN STATUS REPORT

## 2020-02-23 ENCOUNTER — Encounter (INDEPENDENT_AMBULATORY_CARE_PROVIDER_SITE_OTHER): Payer: Self-pay | Admitting: Family Medicine

## 2020-02-29 NOTE — Progress Notes (Signed)
[facility]  [place] [facility] [place] [facility]           Date of Exam: 03/02/2020 12:10 PM      Patient ID: Leonard Cox is a 59 y.o. male.  Attending Physician: Daiva Nakayama, MD      Chief Complaint:   As per HPI.  Otherwise -   Chief Complaint   Patient presents with   . Back Pain            HPI:   HPI    Upper Back Pain  Present for the last 15 days  Patient reached up to pull himself up a tall step, partially fell and stretched out his L arm  Starts near his L shoulder pain and radiates to the anterior of his axilla and to his lateral chest  Exacerbated by sitting for prolonged periods of time when resting his back against a chair  Has been using tylenol, heat, and OTC NSAIDs for pain relief  Has not sought prior medical advice       ROS:   As per HPI.  Otherwise as below.  Review of Systems        Problem List: Past Surgical History:   Patient Active Problem List   Diagnosis   . History of basal cell cancer   . Hemorrhoids, unspecified hemorrhoid type   . S/P ureteral stent placement    Past Surgical History:   Procedure Laterality Date   . APPENDECTOMY     . KIDNEY STONE SURGERY          Current Meds: Allergies:   Outpatient Medications Marked as Taking for the 03/02/20 encounter (Office Visit) with Daiva Nakayama, MD   Medication Sig Dispense Refill   . atorvastatin (LIPITOR) 20 MG tablet TAKE 1 TABLET(20 MG) BY MOUTH DAILY 90 tablet 3    Allergies   Allergen Reactions   . Pyrethrins Shortness Of Breath        Family History: Social History:   Family History   Problem Relation Age of Onset   . Skin cancer Mother    . Melanoma Brother     Social History     Tobacco Use   . Smoking status: Never Smoker   . Smokeless tobacco: Never Used   Vaping Use   . Vaping Use: Never used   Substance Use Topics   . Alcohol use: Yes     Alcohol/week: 1.0 standard drinks     Types: 1 Glasses of wine per week   . Drug use: Never         The following sections were reviewed this encounter  by the provider:   Tobacco  Allergies  Meds  Problems  Med Hx  Surg Hx  Fam Hx            Vital Signs:   There were no vitals taken for this visit.          Physical Exam:   Physical Exam     Ortho Exam     NECK   Observation: normal   Range of Motion: Flexion 60 Deg; Extension 70 Deg; Lateral Flexion L 45 Deg / R 45 Deg; Rotation - L 80 Deg / R 80 Deg   Palpation: Tone: normal ; Tenderness: none   Trigger points: none   Spurling's: negative   Upper Extremity Strength: Globally - 5 / 5   DTR: Biceps - R 2+ ; L  2+   Triceps - R 2+ ; L 2+   Brachioradialis - R 2+ ; L 2+   Sensory: normal   Distal Pulses: normal   Lower Extremity DTR's: normal    L SHOULDER   Observation: normal   Range of Motion (elbow at side): Forward flexion-elevation 180 Deg ; Abduction 180 Deg ; External Rotation 45 Deg ; Internal Rotation 45 Deg ; Scapular motion symmetric; ++ L subscapular crepitus with ROM  Range of Motion (shoulder abducted to 90 deg): External Rotation 90 Deg ; Internal Rotation 90 Deg   Palpation: Acromioclavicular Joint - non-tender              Anterior rotator cuff - non-tender              Clavicle - non-tender              Biceps Tendon - non-tender              Soft tissues - tender over teres major muscle belly > lat dorsi  Impingement: Hawkin's Sign - negative Neer's Sign - negative   Cross-arm adduction: negative   Speed's Test: negative   Yergason's Test : negative   Strength: Supraspinatus - R 5 / 5 ; L 5 / 5   External Rotation - R 5 / 5 ; L 5 / 5   Tummy press - R 5 / 5 ; L 5 / 5   Drop arm: negative   Instability: Ant Apprehension Test - negative;   Labral: Obrien - negative; Crank - negative   Upper extremity distal pulses: normal   Upper extremity sensory: normal     BACK   Gait: normal   Observation: normal   Palpation: Tone: normal ; tender over teres major muscle belly > lat dorsi; ++ L subscapular crepitus with ROM  Range of Motion: Forward Flexion 90 + Deg; Extension 30 Deg; Lateral Flexion L 30  Deg / R 30 Deg   Forward bending: symmetric / normal - no exac pn   Extension: normal - no exac pn   FABER: negative   Straight Leg Raise: negative right and left   DTR: Patellar - R 2+ ; L 2+   Ankle Clonus - negative   Achilles - R 2+ ; L 2+   Motor: Globally - 5 / 5   Leg Lengths: grossly equal   Thomas Test: normal   Piriformis Testing: Frieberg Test: negative   Schobers Test: normal (>5 cm)   Distal Sensory: normal   Distal pulses: intact   Waddell Signs: Distracted SLR neg             Procedure(s):   As per Procedure Note.  Otherwise as below.  Procedures          Assessment:   1. Chronic bilateral low back pain without sciatica  - XR Thoracic Spine AP Lateral And Obliques    2. Upper back pain  - XR Thoracic Spine AP Lateral And Obliques  - metaxalone (Skelaxin) 800 MG tablet; Take 1 tablet (800 mg total) by mouth 3 (three) times daily as needed for Pain  Dispense: 20 tablet; Refill: 0           Plan:     Treatment options discussed with patient at length, including risks, benefits, alternatives, and the nature of any potential procedures for the problem.     L upper back / posterior thoracic pain - teres major > lat dorsi strain with  spasm:  Ice vs. Heat, massage (e.g. Thera-Cane), stretching  Rx muscle relaxant per pt req  OTC NSAIDs prn  OTC Tylenol prn  Relative rest and activity modification  Rehab exercises - PT Rx given  Follow up in 3-6 weeks    30 minutes spent with the patient encounter exclusive of time spent on separately reportable procedures performed and > 50% of that time was spent on counseling patient about the nature of the problem(s), as well as management options including risks, benefits, alternatives, and the nature of any potential procedures for the problem.  More than 50 percent of total office visit time spent on counseling and coordination of care.          Follow-up:   Return in about 6 weeks (around 04/13/2020).          Daiva Nakayama, MD

## 2020-03-02 ENCOUNTER — Ambulatory Visit (INDEPENDENT_AMBULATORY_CARE_PROVIDER_SITE_OTHER): Admitting: Family Medicine

## 2020-03-02 ENCOUNTER — Encounter (INDEPENDENT_AMBULATORY_CARE_PROVIDER_SITE_OTHER): Payer: Self-pay | Admitting: Family Medicine

## 2020-03-02 DIAGNOSIS — M545 Low back pain: Secondary | ICD-10-CM

## 2020-03-02 DIAGNOSIS — G8929 Other chronic pain: Secondary | ICD-10-CM

## 2020-03-02 DIAGNOSIS — M549 Dorsalgia, unspecified: Secondary | ICD-10-CM

## 2020-03-02 MED ORDER — METAXALONE 800 MG PO TABS
800.0000 mg | ORAL_TABLET | Freq: Three times a day (TID) | ORAL | 0 refills | Status: AC | PRN
Start: 2020-03-02 — End: ?

## 2020-03-02 NOTE — Progress Notes (Signed)
Upper Back Pain  Present for the last 15 days  Patient reached up to pull himself up a tall step, partially fell and stretched out his L arm  Starts near his L shoulder pain and radiates to the anterior of his axilla and to his lateral chest  Exacerbated by sitting for prolonged periods of time when resting his back against a chair  Has been using tylenol, heat, and OTC NSAIDs for pain relief  Has not sought prior medical advice

## 2020-03-02 NOTE — Patient Instructions (Signed)
L upper back / posterior thoracic pain - teres major > lat dorsi strain with spasm:  Ice vs. Heat, massage (e.g. Thera-Cane), stretching  Rx muscle relaxant per pt req  OTC NSAIDs prn  OTC Tylenol prn  Relative rest and activity modification  Rehab exercises - PT Rx given  Follow up in 3-6 weeks

## 2020-03-03 ENCOUNTER — Other Ambulatory Visit: Payer: Self-pay | Admitting: Family Medicine

## 2020-03-03 ENCOUNTER — Encounter (INDEPENDENT_AMBULATORY_CARE_PROVIDER_SITE_OTHER): Payer: Self-pay | Admitting: Family Medicine

## 2020-03-03 ENCOUNTER — Telehealth (INDEPENDENT_AMBULATORY_CARE_PROVIDER_SITE_OTHER): Payer: Self-pay | Admitting: Family Medicine

## 2020-03-03 NOTE — Telephone Encounter (Signed)
Patient called today wanting to make a PT appointment and there is no PT Order in chart.    Please upload PT Order if PT is needed.    Thank you

## 2020-03-03 NOTE — Telephone Encounter (Signed)
PT was ordered by Dr. Anselm Jungling outside of [facility], awaiting scanned order

## 2020-03-04 NOTE — Telephone Encounter (Signed)
Order is now in chart under a scan encounter. Thanks!

## 2020-03-07 NOTE — Progress Notes (Signed)
X-Ray results c/w DJD changes; otherwise unremarkable.  Continue with current plan as last discussed with patient.

## 2020-04-18 ENCOUNTER — Ambulatory Visit (INDEPENDENT_AMBULATORY_CARE_PROVIDER_SITE_OTHER): Admitting: Student in an Organized Health Care Education/Training Program

## 2020-05-09 ENCOUNTER — Encounter (INDEPENDENT_AMBULATORY_CARE_PROVIDER_SITE_OTHER): Payer: Self-pay | Admitting: Family Medicine

## 2020-07-24 ENCOUNTER — Encounter (INDEPENDENT_AMBULATORY_CARE_PROVIDER_SITE_OTHER): Payer: Self-pay | Admitting: Family Medicine

## 2020-07-24 ENCOUNTER — Other Ambulatory Visit (INDEPENDENT_AMBULATORY_CARE_PROVIDER_SITE_OTHER): Payer: Self-pay | Admitting: Family Medicine

## 2020-07-24 DIAGNOSIS — E7841 Elevated Lipoprotein(a): Secondary | ICD-10-CM

## 2020-07-24 MED ORDER — ATORVASTATIN CALCIUM 20 MG PO TABS
20.0000 mg | ORAL_TABLET | Freq: Every day | ORAL | 1 refills | Status: AC
Start: 2020-07-24 — End: ?

## 2020-07-29 NOTE — Telephone Encounter (Signed)
07/29/2020 Patient decline PT appointment, he is feeling better. A.H.
